# Patient Record
Sex: Female | Born: 1990 | Race: Black or African American | Hispanic: No | Marital: Single | Smoking: Current some day smoker
Health system: Southern US, Community
[De-identification: ages and names within clinical notes are randomized; demographics above are authoritative.]

## PROBLEM LIST (undated history)

## (undated) HISTORY — PX: TONSILLECTOMY: SUR1361

## (undated) HISTORY — PX: INDUCED ABORTION: SHX677

---
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
## 2008-05-11 ENCOUNTER — Emergency Department (HOSPITAL_COMMUNITY): Admission: EM | Admit: 2008-05-11 | Discharge: 2008-05-11 | Payer: Self-pay | Admitting: Family Medicine

## 2008-06-05 ENCOUNTER — Emergency Department (HOSPITAL_COMMUNITY): Admission: EM | Admit: 2008-06-05 | Discharge: 2008-06-05 | Payer: Self-pay | Admitting: Emergency Medicine

## 2008-07-10 ENCOUNTER — Emergency Department (HOSPITAL_COMMUNITY): Admission: EM | Admit: 2008-07-10 | Discharge: 2008-07-11 | Payer: Self-pay | Admitting: Emergency Medicine

## 2010-12-06 NOTE — Op Note (Signed)
Courtney Moss, Courtney Moss       ACCOUNT NO.:  [id number]   MEDICAL RECORD NO.:  [id number]          PATIENT TYPE:  AMB   LOCATION:  DSC                          FACILITY:  [facility]   PHYSICIAN:  Hermelinda Medicus, M.D.   DATE OF BIRTH:  [birth year]   DATE OF PROCEDURE:  02/18/2005  DATE OF DISCHARGE:                                 OPERATIVE REPORT   PREOPERATIVE DIAGNOSIS:  Tonsillitis with history of Streptococcus-positive.   POSTOPERATIVE DIAGNOSIS:  Tonsillitis with history of Streptococcus-  positive.   OPERATION:  Tonsillectomy.   ANESTHESIA:  General endotracheal with Dr. Noreene Larsson.   SURGEON:  Hermelinda Medicus, M.D.   PROCEDURE:  The patient was placed in a supine position and under general  orotracheal anesthesia, the tonsillar gag was placed after usual prepping  and draping.  The tonsils were exudative; they were grasped with a tenaculum  and removed using blunt and Bovie electrocoagulation for hemostasis and each  tonsil was separated and sent for microscopic evaluation.  All hemostasis  was established with the Bovie electrocoagulation.  Total blood loss was  less than 10 mL.  The patient tolerated the procedure well and is doing well  postop.   Followup will be in 1 week, 3 weeks and 6 weeks.  Parents are aware of the  dietary restrictions and also the travel restrictions of approximately 10-12  days of no travel way from medical care.       JC/MEDQ  D:  02/18/2005  T:  02/19/2005  Job:  [id number]   cc:   Caryn Bee L. Little, M.D.  [address]  [place]  [place] [address]  Fax: [phone]

## 2010-12-06 NOTE — H&P (Signed)
NAMEMARCHELLE, Courtney Moss       ACCOUNT NO.:  [id number]   MEDICAL RECORD NO.:  [id number]          PATIENT TYPE:  AMB   LOCATION:  DSC                          FACILITY:  [facility]   PHYSICIAN:  Hermelinda Medicus, M.D.   DATE OF BIRTH:  Nov 07, 1990   DATE OF ADMISSION:  02/18/2005  DATE OF DISCHARGE:                                HISTORY & PHYSICAL   This patient is a 20 year old female who has had a four-year history of  tonsillitis, six episodes per year, and has also been Strep positive on  several occasions.  She has had no other difficulties.  Her general health  has been good.  She, however, has missed some school with this and the  family wished to get her improved from this situation.   She has no allergies to medications.   She has never had previous surgery.   She has no bowel or bladder, kidney, respiratory problems.   Her medications have been primarily the cephalosporins and the Augmentin.  She takes no medications at this time.   The only family history is her Dad does have some hypertension problems, and  she has had some mild acne in the past.   PHYSICAL EXAMINATION:  VITAL SIGNS:  Blood pressure is 115/70, respirations  18, pulse 65.  HEENT:  Ears are clear.  Oral cavity is clear.  Tympanic membranes look  excellent.  Oral cavity is free of any ulceration or mass.  She just has  exudative 2+ size tonsils.  NECK:  Free of any thyromegaly, but has some mild cervical adenopathy, but  no tenderness.  CHEST:  Clear.  No rales, rhonchi, or wheezes.  CARDIOVASCULAR:  __________  murmurs or gallops.  ABDOMEN:  Unremarkable.  EXTREMITIES:  Unremarkable.   INITIAL DIAGNOSIS:  Tonsillitis.  A history of tonsillitis times six over a  four-year period, total of 24 episodes with multiple Streptococcal  infections with considerable loss of school.   PLAN:  Tonsillectomy.       JC/MEDQ  D:  02/18/2005  T:  02/18/2005  Job:  [id number]   cc:   Caryn Bee L. Little, M.D.  [address]  [place]  [place] [address]  Fax: [phone]

## 2011-04-22 LAB — POCT URINALYSIS DIP (DEVICE)
Bilirubin Urine: NEGATIVE
Glucose, UA: NEGATIVE mg/dL
Ketones, ur: NEGATIVE mg/dL
Nitrite: NEGATIVE
Operator id: 247071
Protein, ur: 30 mg/dL — AB
Specific Gravity, Urine: 1.015 (ref 1.005–1.030)
Urobilinogen, UA: 0.2 mg/dL (ref 0.0–1.0)
pH: 7 (ref 5.0–8.0)

## 2011-04-22 LAB — URINE CULTURE: Colony Count: 35000

## 2011-04-22 LAB — POCT PREGNANCY, URINE: Preg Test, Ur: NEGATIVE

## 2011-04-25 LAB — POCT I-STAT, CHEM 8
BUN: 9 mg/dL (ref 6–23)
Calcium, Ion: 1.05 mmol/L — ABNORMAL LOW (ref 1.12–1.32)
Chloride: 107 mEq/L (ref 96–112)
Creatinine, Ser: 0.9 mg/dL (ref 0.4–1.2)
Glucose, Bld: 90 mg/dL (ref 70–99)
HCT: 39 % (ref 36.0–46.0)
Hemoglobin: 13.3 g/dL (ref 12.0–15.0)
Potassium: 4.4 mEq/L (ref 3.5–5.1)
Sodium: 140 mEq/L (ref 135–145)
TCO2: 25 mmol/L (ref 0–100)

## 2011-04-25 LAB — URINALYSIS, ROUTINE W REFLEX MICROSCOPIC
Bilirubin Urine: NEGATIVE
Glucose, UA: NEGATIVE mg/dL
Ketones, ur: NEGATIVE mg/dL
Leukocytes, UA: NEGATIVE
Nitrite: NEGATIVE
Protein, ur: NEGATIVE mg/dL
Specific Gravity, Urine: 1.012 (ref 1.005–1.030)
Urobilinogen, UA: 0.2 mg/dL (ref 0.0–1.0)
pH: 7 (ref 5.0–8.0)

## 2011-04-25 LAB — DIFFERENTIAL
Basophils Absolute: 0 10*3/uL (ref 0.0–0.1)
Basophils Relative: 1 % (ref 0–1)
Eosinophils Absolute: 0.1 10*3/uL (ref 0.0–0.7)
Eosinophils Relative: 2 % (ref 0–5)
Lymphocytes Relative: 28 % (ref 12–46)
Lymphs Abs: 1.8 10*3/uL (ref 0.7–4.0)
Monocytes Absolute: 0.1 10*3/uL (ref 0.1–1.0)
Monocytes Relative: 2 % — ABNORMAL LOW (ref 3–12)
Neutro Abs: 4.2 10*3/uL (ref 1.7–7.7)
Neutrophils Relative %: 67 % (ref 43–77)

## 2011-04-25 LAB — RAPID URINE DRUG SCREEN, HOSP PERFORMED
Amphetamines: NOT DETECTED
Barbiturates: NOT DETECTED
Benzodiazepines: NOT DETECTED
Cocaine: NOT DETECTED
Opiates: NOT DETECTED
Tetrahydrocannabinol: NOT DETECTED

## 2011-04-25 LAB — CBC
HCT: 38.6 % (ref 36.0–46.0)
Hemoglobin: 12.8 g/dL (ref 12.0–15.0)
MCHC: 33.1 g/dL (ref 30.0–36.0)
MCV: 85.1 fL (ref 78.0–100.0)
Platelets: 239 10*3/uL (ref 150–400)
RBC: 4.53 MIL/uL (ref 3.87–5.11)
RDW: 14.2 % (ref 11.5–15.5)
WBC: 6.2 10*3/uL (ref 4.0–10.5)

## 2011-04-25 LAB — URINE MICROSCOPIC-ADD ON

## 2011-04-25 LAB — ETHANOL: Alcohol, Ethyl (B): <5 mg/dL (ref 0–10)

## 2014-01-23 ENCOUNTER — Encounter (HOSPITAL_COMMUNITY): Payer: Self-pay | Admitting: Emergency Medicine

## 2014-01-23 ENCOUNTER — Emergency Department (HOSPITAL_COMMUNITY)
Admission: EM | Admit: 2014-01-23 | Discharge: 2014-01-23 | Disposition: A | Discharge status: Home / Self Care | Payer: BC Managed Care – PPO | Attending: Emergency Medicine | Admitting: Emergency Medicine

## 2014-01-23 ENCOUNTER — Emergency Department (HOSPITAL_COMMUNITY): Payer: BC Managed Care – PPO

## 2014-01-23 DIAGNOSIS — Z87891 Personal history of nicotine dependence: Secondary | ICD-10-CM | POA: Insufficient documentation

## 2014-01-23 DIAGNOSIS — O9989 Other specified diseases and conditions complicating pregnancy, childbirth and the puerperium: Secondary | ICD-10-CM | POA: Insufficient documentation

## 2014-01-23 DIAGNOSIS — Z9889 Other specified postprocedural states: Secondary | ICD-10-CM | POA: Insufficient documentation

## 2014-01-23 DIAGNOSIS — N939 Abnormal uterine and vaginal bleeding, unspecified: Secondary | ICD-10-CM

## 2014-01-23 DIAGNOSIS — R1031 Right lower quadrant pain: Secondary | ICD-10-CM | POA: Insufficient documentation

## 2014-01-23 DIAGNOSIS — R1032 Left lower quadrant pain: Secondary | ICD-10-CM | POA: Insufficient documentation

## 2014-01-23 DIAGNOSIS — O209 Hemorrhage in early pregnancy, unspecified: Secondary | ICD-10-CM | POA: Insufficient documentation

## 2014-01-23 LAB — BASIC METABOLIC PANEL
Anion gap: 14 (ref 5–15)
BUN: 6 mg/dL (ref 6–23)
CO2: 23 mEq/L (ref 19–32)
Calcium: 9.1 mg/dL (ref 8.4–10.5)
Chloride: 102 mEq/L (ref 96–112)
Creatinine, Ser: 0.58 mg/dL (ref 0.50–1.10)
GFR calc Af Amer: >90 mL/min (ref >90)
GFR calc non Af Amer: >90 mL/min (ref >90)
Glucose, Bld: 80 mg/dL (ref 70–99)
Potassium: 4.4 mEq/L (ref 3.7–5.3)
Sodium: 139 mEq/L (ref 137–147)

## 2014-01-23 LAB — CBC WITH DIFFERENTIAL/PLATELET
Basophils Absolute: 0 10*3/uL (ref 0.0–0.1)
Basophils Relative: 0 % (ref 0–1)
Eosinophils Absolute: 0.1 10*3/uL (ref 0.0–0.7)
Eosinophils Relative: 1 % (ref 0–5)
HCT: 35.2 % — ABNORMAL LOW (ref 36.0–46.0)
Hemoglobin: 12.4 g/dL (ref 12.0–15.0)
Lymphocytes Relative: 29 % (ref 12–46)
Lymphs Abs: 2.2 10*3/uL (ref 0.7–4.0)
MCH: 28.4 pg (ref 26.0–34.0)
MCHC: 35.2 g/dL (ref 30.0–36.0)
MCV: 80.5 fL (ref 78.0–100.0)
Monocytes Absolute: 0.8 10*3/uL (ref 0.1–1.0)
Monocytes Relative: 11 % (ref 3–12)
Neutro Abs: 4.5 10*3/uL (ref 1.7–7.7)
Neutrophils Relative %: 59 % (ref 43–77)
Platelets: 218 10*3/uL (ref 150–400)
RBC: 4.37 MIL/uL (ref 3.87–5.11)
RDW: 12.9 % (ref 11.5–15.5)
WBC: 7.7 10*3/uL (ref 4.0–10.5)

## 2014-01-23 LAB — URINE MICROSCOPIC-ADD ON

## 2014-01-23 LAB — URINALYSIS, ROUTINE W REFLEX MICROSCOPIC
Bilirubin Urine: NEGATIVE
Glucose, UA: NEGATIVE mg/dL
Hgb urine dipstick: NEGATIVE
Ketones, ur: 40 mg/dL — AB
Nitrite: NEGATIVE
Protein, ur: NEGATIVE mg/dL
Specific Gravity, Urine: 1.019 (ref 1.005–1.030)
Urobilinogen, UA: 1 mg/dL (ref 0.0–1.0)
pH: 6.5 (ref 5.0–8.0)

## 2014-01-23 LAB — ABO/RH: ABO/RH(D): A POS

## 2014-01-23 LAB — HCG, QUANTITATIVE, PREGNANCY: hCG, Beta Chain, Quant, S: 48151 m[IU]/mL — ABNORMAL HIGH (ref <5)

## 2014-01-23 LAB — POC URINE PREG, ED: Preg Test, Ur: POSITIVE — AB

## 2014-01-23 MED ORDER — COMPLETENATE 29-1 MG PO CHEW
1.0000 | CHEWABLE_TABLET | Freq: Every day | ORAL | Status: AC
Start: 2014-01-23 — End: ?

## 2014-01-23 NOTE — ED Provider Notes (Signed)
CSN: [id number]     Arrival date & time 01/23/14  1611 History   First MD Initiated Contact with Patient 01/23/14 1928     Chief Complaint  Patient presents with  . Vaginal Bleeding     (Consider location/radiation/quality/duration/timing/severity/associated sxs/prior Treatment) HPI 23 year old female with no significant past medical history the presents here today with vaginal spotting with positive home pregnancy test. Patient states that the pregnancy test was 3 weeks prior however she believes the date of conception was the first week of May. Patient was taken the new ring was unable to have her prescription for that time period. Patient states the last week she has been having light vaginal spotting was noted clots, no bleeding more than a period. Patient also has been having bilateral suprapubic and lower quadrants abdominal cramping for the past week that is intermittent in nature not associated with food. Patient denies dysuria hematuria but has had recurrent UTIs in the past.  History reviewed. No pertinent past medical history. Past Surgical History  Procedure Laterality Date  . Tonsillectomy    . Induced abortion     History reviewed. No pertinent family history. History  Substance Use Topics  . Smoking status: Former Games developermoker  . Smokeless tobacco: Not on file  . Alcohol Use: Yes   OB History   Grav Para Term Preterm Abortions TAB SAB Ect Mult Living                 Review of Systems  Constitutional: Negative for activity change.  HENT: Negative for congestion.   Respiratory: Negative for cough and shortness of breath.   Cardiovascular: Negative for chest pain and leg swelling.  Gastrointestinal: Positive for abdominal pain. Negative for nausea, vomiting, diarrhea, constipation, blood in stool and abdominal distention.  Genitourinary: Positive for vaginal bleeding. Negative for dysuria, flank pain and vaginal discharge.  Musculoskeletal: Negative for back pain.  Skin:  Negative for color change.  Neurological: Negative for syncope and headaches.  Psychiatric/Behavioral: Negative for agitation.      Allergies  Review of patient's allergies indicates no known allergies.  Home Medications   Prior to Admission medications   Not on File   BP 121/79  Pulse 61  Temp(Src) 97.6 F (36.4 C) (Oral)  Resp 16  Ht 5\' 7"  (1.702 m)  Wt 170 lb 12.8 oz (77.474 kg)  BMI 26.74 kg/m2  SpO2 100% Physical Exam  Constitutional: She is oriented to person, place, and time. She appears well-developed.  HENT:  Head: Normocephalic.  Eyes: Pupils are equal, round, and reactive to light.  Neck: Neck supple.  Cardiovascular: Normal rate.  Exam reveals no gallop and no friction rub.   No murmur heard. Pulmonary/Chest: Effort normal and breath sounds normal. No respiratory distress.  Abdominal: Soft. She exhibits no distension. There is tenderness in the right lower quadrant, suprapubic area and left lower quadrant. There is no rigidity, no rebound, no guarding, no CVA tenderness, no tenderness at McBurney's point and negative Murphy's sign.  Genitourinary: Cervix exhibits no motion tenderness. Right adnexum displays no mass, no tenderness and no fullness. Left adnexum displays no mass, no tenderness and no fullness. No bleeding around the vagina. No vaginal discharge found.  Musculoskeletal: She exhibits no edema.  Neurological: She is alert and oriented to person, place, and time.  Skin: Skin is warm.  Psychiatric: She has a normal mood and affect.    ED Course  Procedures (including critical care time) Labs Review Labs Reviewed  URINALYSIS, ROUTINE  W REFLEX MICROSCOPIC - Abnormal; Notable for the following:    Ketones, ur 40 (*)    Leukocytes, UA TRACE (*)    All other components within normal limits  CBC WITH DIFFERENTIAL - Abnormal; Notable for the following:    HCT 35.2 (*)    All other components within normal limits  HCG, QUANTITATIVE, PREGNANCY -  Abnormal; Notable for the following:    hCG, Beta Chain, Quant, S [id number] (*)    All other components within normal limits  URINE MICROSCOPIC-ADD ON - Abnormal; Notable for the following:    Squamous Epithelial / LPF FEW (*)    All other components within normal limits  POC URINE PREG, ED - Abnormal; Notable for the following:    Preg Test, Ur POSITIVE (*)    All other components within normal limits  GC/CHLAMYDIA PROBE AMP  BASIC METABOLIC PANEL  ABO/RH    Imaging Review [place] Ob Comp Less 14 Wks  01/23/2014   CLINICAL DATA:  Vaginal bleeding, positive pregnancy test  EXAM: OBSTETRIC <14 WK US AND TRANSVAGINAL OB US  TECHNIQUE: Both transabdominal and transvaginal ultrasound examinations were performed for complete evaluation of the gestation as well as the maternal uterus, adnexal regions, and pelvic cul-de-sac. Transvaginal technique was performed to assess early pregnancy.  COMPARISON:  None.  FINDINGS: Intrauterine gestational sac: Visualized/normal in shape.  Yolk sac:  Visualized  Embryo:  Visualized  Cardiac Activity: Visualized  Heart Rate:  165 bpm  CRL:   23  mm   9 w 0d                  US EDC: 08/28/14  Maternal uterus/adnexae: Right ovary is obscured by bowel gas. No right adnexal mass. Left ovary is normal. No free fluid. Trace anechoic fluid may represent remote subchorionic hemorrhage.  IMPRESSION: Single live intrauterine embryo measuring 9 weeks 0 days by crown-rump length today. No acute abnormality.   Electronically Signed   By: Christiana PellantGretchen  Green M.D.   On: 01/23/2014 21:36   [place] Ob Transvaginal  01/23/2014   CLINICAL DATA:  Vaginal bleeding, positive pregnancy test  EXAM: OBSTETRIC <14 WK US AND TRANSVAGINAL OB US  TECHNIQUE: Both transabdominal and transvaginal ultrasound examinations were performed for complete evaluation of the gestation as well as the maternal uterus, adnexal regions, and pelvic cul-de-sac. Transvaginal technique was performed to assess early pregnancy.  COMPARISON:   None.  FINDINGS: Intrauterine gestational sac: Visualized/normal in shape.  Yolk sac:  Visualized  Embryo:  Visualized  Cardiac Activity: Visualized  Heart Rate:  165 bpm  CRL:   23  mm   9 w 0d                  US EDC: 08/28/14  Maternal uterus/adnexae: Right ovary is obscured by bowel gas. No right adnexal mass. Left ovary is normal. No free fluid. Trace anechoic fluid may represent remote subchorionic hemorrhage.  IMPRESSION: Single live intrauterine embryo measuring 9 weeks 0 days by crown-rump length today. No acute abnormality.   Electronically Signed   By: Christiana PellantGretchen  Green M.D.   On: 01/23/2014 21:36     EKG Interpretation None      MDM   Final diagnoses:  Vaginal bleeding   23 y/o female presents with first trimester vaginal bleeding and minimal lower abdominal pain. Vaginal exam- no blood in the vault and no discharge, no significant tenderness on bimanual exam.  Transabdominal US noted to reveal a 9week IUP with Fetal cardiac  activity.  On repeat exam patient does not have abdominal tenderness, patient started on prenatal vitamin and given resources to establish pre-natal care. Patient was then discharged with return precautions given.      Clement Sayres, MD 01/24/14 2225

## 2014-01-23 NOTE — ED Notes (Signed)
She had a positive home preg test 3 weeks ago. This week shes had vaginal spotting. She denies pain. She reports loss of appetite and n/v. She does not have an Web designerBGYN

## 2014-01-23 NOTE — ED Notes (Signed)
Apologized to pt for wait time. Pt in NAD. Denies any pain. Pt made aware of need for urine sample.

## 2014-01-23 NOTE — ED Notes (Signed)
Pt in US at this time 

## 2014-01-24 LAB — GC/CHLAMYDIA PROBE AMP
CT Probe RNA: NEGATIVE
GC Probe RNA: NEGATIVE

## 2014-01-25 NOTE — ED Provider Notes (Signed)
I saw and evaluated the patient, reviewed the resident's note and I agree with the findings and plan. If applicable, I agree with the resident's interpretation of the EKG.  If applicable, I was present for critical portions of any procedures performed.  +HCG with vaginal bleeding.  Abdomen soft and nontender.  Vitals stable. +IUP. +Rh.  Glynn OctaveStephen Andi Mahaffy, MD 01/25/14 [phone]

## 2015-06-18 ENCOUNTER — Emergency Department (HOSPITAL_COMMUNITY)
Admission: EM | Admit: 2015-06-18 | Discharge: 2015-06-18 | Discharge status: Against Medical Advice | Payer: BLUE CROSS/BLUE SHIELD | Attending: Emergency Medicine | Admitting: Emergency Medicine

## 2015-06-18 ENCOUNTER — Encounter (HOSPITAL_COMMUNITY): Payer: Self-pay | Admitting: Emergency Medicine

## 2015-06-18 DIAGNOSIS — R5383 Other fatigue: Secondary | ICD-10-CM | POA: Insufficient documentation

## 2015-06-18 DIAGNOSIS — Z3A00 Weeks of gestation of pregnancy not specified: Secondary | ICD-10-CM | POA: Insufficient documentation

## 2015-06-18 DIAGNOSIS — O9989 Other specified diseases and conditions complicating pregnancy, childbirth and the puerperium: Secondary | ICD-10-CM | POA: Insufficient documentation

## 2015-06-18 DIAGNOSIS — O219 Vomiting of pregnancy, unspecified: Secondary | ICD-10-CM | POA: Insufficient documentation

## 2015-06-18 LAB — URINALYSIS, ROUTINE W REFLEX MICROSCOPIC
Bilirubin Urine: NEGATIVE
GLUCOSE, UA: NEGATIVE mg/dL
HGB URINE DIPSTICK: NEGATIVE
KETONES UR: 15 mg/dL — AB
Leukocytes, UA: NEGATIVE
Nitrite: NEGATIVE
PROTEIN: 30 mg/dL — AB
Specific Gravity, Urine: 1.016 (ref 1.005–1.030)
pH: 6 (ref 5.0–8.0)

## 2015-06-18 LAB — URINE MICROSCOPIC-ADD ON: RBC / HPF: NONE SEEN RBC/hpf (ref 0–5)

## 2015-06-18 LAB — CBC
HCT: 40.4 % (ref 36.0–46.0)
Hemoglobin: 14.2 g/dL (ref 12.0–15.0)
MCH: 28.6 pg (ref 26.0–34.0)
MCHC: 35.1 g/dL (ref 30.0–36.0)
MCV: 81.3 fL (ref 78.0–100.0)
PLATELETS: 250 10*3/uL (ref 150–400)
RBC: 4.97 MIL/uL (ref 3.87–5.11)
RDW: 13.4 % (ref 11.5–15.5)
WBC: 6.7 10*3/uL (ref 4.0–10.5)

## 2015-06-18 LAB — COMPREHENSIVE METABOLIC PANEL
ALK PHOS: 58 U/L (ref 38–126)
ALT: 12 U/L — AB (ref 14–54)
AST: 17 U/L (ref 15–41)
Albumin: 3.6 g/dL (ref 3.5–5.0)
Anion gap: 8 (ref 5–15)
BUN: 5 mg/dL — AB (ref 6–20)
CALCIUM: 8.7 mg/dL — AB (ref 8.9–10.3)
CHLORIDE: 104 mmol/L (ref 101–111)
CO2: 23 mmol/L (ref 22–32)
Creatinine, Ser: 0.64 mg/dL (ref 0.44–1.00)
GFR calc Af Amer: >60 mL/min (ref >60)
Glucose, Bld: 94 mg/dL (ref 65–99)
Potassium: 3.4 mmol/L — ABNORMAL LOW (ref 3.5–5.1)
Sodium: 135 mmol/L (ref 135–145)
Total Bilirubin: 0.9 mg/dL (ref 0.3–1.2)
Total Protein: 7 g/dL (ref 6.5–8.1)

## 2015-06-18 LAB — POC URINE PREG, ED: Preg Test, Ur: POSITIVE — AB

## 2015-06-18 LAB — LIPASE, BLOOD: LIPASE: 24 U/L (ref 11–51)

## 2015-06-18 NOTE — ED Notes (Signed)
Pt stated that she didn't want to wait any longer and that she would come back if pain got worse

## 2015-06-18 NOTE — ED Notes (Signed)
Pt. reports fatigue , chills , emesis and diarrhea onset last week .

## 2017-06-16 ENCOUNTER — Emergency Department (HOSPITAL_COMMUNITY)
Admission: EM | Admit: 2017-06-16 | Discharge: 2017-06-16 | Disposition: A | Discharge status: Home / Self Care | Payer: Medicaid Other | Attending: Emergency Medicine | Admitting: Emergency Medicine

## 2017-06-16 ENCOUNTER — Encounter (HOSPITAL_COMMUNITY): Payer: Self-pay

## 2017-06-16 ENCOUNTER — Emergency Department (HOSPITAL_COMMUNITY): Payer: Medicaid Other

## 2017-06-16 ENCOUNTER — Other Ambulatory Visit: Payer: Self-pay

## 2017-06-16 DIAGNOSIS — Z79899 Other long term (current) drug therapy: Secondary | ICD-10-CM | POA: Diagnosis not present

## 2017-06-16 DIAGNOSIS — Z87891 Personal history of nicotine dependence: Secondary | ICD-10-CM | POA: Insufficient documentation

## 2017-06-16 DIAGNOSIS — B349 Viral infection, unspecified: Secondary | ICD-10-CM | POA: Insufficient documentation

## 2017-06-16 DIAGNOSIS — R05 Cough: Secondary | ICD-10-CM | POA: Diagnosis present

## 2017-06-16 LAB — I-STAT BETA HCG BLOOD, ED (MC, WL, AP ONLY): I-stat hCG, quantitative: <5 m[IU]/mL (ref <5)

## 2017-06-16 LAB — RAPID STREP SCREEN (MED CTR MEBANE ONLY): Streptococcus, Group A Screen (Direct): NEGATIVE

## 2017-06-16 LAB — D-DIMER, QUANTITATIVE: D-Dimer, Quant: 1.72 ug/mL-FEU — ABNORMAL HIGH (ref 0.00–0.50)

## 2017-06-16 MED ORDER — IBUPROFEN 800 MG PO TABS
800.0000 mg | ORAL_TABLET | Freq: Once | ORAL | Status: AC
  Administered 2017-06-16: 800 mg via ORAL
  Filled 2017-06-16: qty 1

## 2017-06-16 MED ORDER — FLUTICASONE PROPIONATE 50 MCG/ACT NA SUSP: 1.0000 | Freq: Every day | NASAL | 2 refills | Status: AC

## 2017-06-16 MED ORDER — IBUPROFEN 800 MG PO TABS: 800.0000 mg | ORAL_TABLET | Freq: Three times a day (TID) | ORAL | 0 refills | Status: AC | PRN

## 2017-06-16 MED ORDER — BENZONATATE 100 MG PO CAPS: 100.0000 mg | ORAL_CAPSULE | Freq: Three times a day (TID) | ORAL | 0 refills | Status: AC | PRN

## 2017-06-16 MED ORDER — BENZONATATE 100 MG PO CAPS
100.0000 mg | ORAL_CAPSULE | Freq: Once | ORAL | Status: AC
  Administered 2017-06-16: 100 mg via ORAL
  Filled 2017-06-16: qty 1

## 2017-06-16 MED ORDER — IOPAMIDOL (ISOVUE-370) INJECTION 76%
INTRAVENOUS | Status: AC
  Administered 2017-06-16: 100 mL
  Filled 2017-06-16: qty 100

## 2017-06-16 NOTE — Discharge Instructions (Addendum)
You were seen in the emergency department today and diagnosed with a viral illness. I have prescribed you Flonase to help with your congestion- use this daily, 1 spray in each nostril. I have prescribed you Tessalon to help with your cough take this once every 8 hours as needed for cough. I have prescribed you Ibuprofen- take this every 8 hours as needed for pain. Take the Ibuprofen with food as this may upset your stomach and at worst cause stomach bleeding. Your chest X-ray did not show any signs of pneumonia. Your CT scan did not show a blood clot in your lungs.   Follow up with the internal medicine office provided to establish a primary care provider in the area and for re-evaluation. Return to the emergency department for any new/worsening symptoms including but not limited to shortness of breath, dizziness, lightheadedness, or passing out episode.

## 2017-06-16 NOTE — ED Provider Notes (Signed)
[facility]   CSN: [id number] Arrival date & time: 06/16/17  [id number]     History   Chief Complaint Chief Complaint  Patient presents with  . sore throat/congestion/back pain    HPI Courtney Moss is a 26 y.o. female presents with complaints of URI sxs x 3 days and chest/upper back pain x 1 day. Patient states symptoms started with congestion, rhinorrhea, L ear discomfort, subjective fever, chills, and sore throat. Progressed to intermittent cough, sputum produced is green with blood. States she then developed central chest discomfort and diffuse upper back pain. Relays this has been a constant pain that is sharp and worse with a deep breath and coughing. Has tried Tylenol without relief. States feels somewhat similar to previous pneumonia. Denies leg pain/swelling, recent surgery/trauma, recent long travel, hormone use, personal hx of cancer, or hx of DVT/PE. LMP 1.5 weeks prior, denies chance of pregnancy.  HPI  History reviewed. No pertinent past medical history.  There are no active problems to display for this patient.   Past Surgical History:  Procedure Laterality Date  . INDUCED ABORTION    . TONSILLECTOMY      OB History    No data available       Home Medications    Prior to Admission medications   Medication Sig Start Date End Date Taking? Authorizing Provider  acetaminophen (TYLENOL) 325 MG tablet Take 650 mg by mouth every 6 (six) hours as needed for headache.    [provider]  fexofenadine-pseudoephedrine (ALLEGRA-D) 60-120 MG per tablet Take 1 tablet by mouth 2 (two) times daily.    [provider]  prenatal vitamin w/FE, FA (NATACHEW) 29-1 MG CHEW chewable tablet Chew 1 tablet by mouth daily at 12 noon. 01/23/14   Clement SayresShepard, Staci, MD  pseudoephedrine (SUDAFED) 30 MG tablet Take 30 mg by mouth every 4 (four) hours as needed for congestion.    [provider]    Family  History No family history on file.  Social History Social History   Tobacco Use  . Smoking status: Former Smoker  Substance Use Topics  . Alcohol use: Yes  . Drug use: Yes     Allergies   Patient has no known allergies.   Review of Systems Review of Systems  Constitutional: Positive for chills and fever (subjective).  HENT: Positive for congestion, ear pain (L), rhinorrhea and sore throat.   Eyes: Negative for discharge, redness, itching and visual disturbance.  Respiratory: Positive for cough. Negative for shortness of breath and wheezing.   Cardiovascular: Positive for chest pain. Negative for palpitations.  Gastrointestinal: Negative for abdominal pain, diarrhea and vomiting.  Genitourinary: Negative for dysuria, vaginal bleeding and vaginal discharge.  Musculoskeletal: Positive for back pain.       Negative for leg pain or swelling  Skin: Negative for rash.  Neurological: Negative for dizziness, syncope and weakness.       Negative for incontinence  All other systems reviewed and are negative.   Physical Exam Updated Vital Signs BP 129/80 (BP Location: Right Arm)   Pulse 65   Temp 98.4 F (36.9 C) (Oral)   Resp 16   SpO2 100%   Physical Exam  Constitutional: She appears well-developed and well-nourished. No distress.  HENT:  Head: Normocephalic and atraumatic.  Right Ear: Tympanic membrane is not perforated, not erythematous, not retracted and not bulging.  Left Ear: Tympanic membrane is not perforated, not erythematous, not retracted and not bulging.  Mouth/Throat: Uvula is midline. Posterior oropharyngeal erythema present. No oropharyngeal exudate.  Nasal congestion present.   Eyes: Conjunctivae are normal. Pupils are equal, round, and reactive to light. Right eye exhibits no discharge. Left eye exhibits no discharge.  Neck: Normal range of motion. Neck supple.  Cardiovascular: Normal rate and regular rhythm.  No murmur heard. Pulmonary/Chest: Breath  sounds normal. No respiratory distress. She has no wheezes. She has no rales.  Abdominal: Soft. She exhibits no distension. There is no tenderness.  Musculoskeletal:  Back: No vertebral tenderness.   Lymphadenopathy:    She has no cervical adenopathy.  Neurological: She is alert.  Clear speech. Normal gait. 5/5 grip strength and plantar/dorsi flexion bilaterally. Sensation grossly intact x 4.   Skin: Skin is warm and dry. No rash noted.  Psychiatric: She has a normal mood and affect. Her behavior is normal.  Nursing note and vitals reviewed.     ED Treatments / Results  Labs Results for orders placed or performed during the hospital encounter of 06/16/17  Rapid strep screen  Result Value Ref Range   Streptococcus, Group A Screen (Direct) NEGATIVE NEGATIVE  D-dimer, quantitative  Result Value Ref Range   D-Dimer, Quant 1.72 (H) 0.00 - 0.50 ug/mL-FEU  I-Stat Beta hCG blood, ED ([facility], [facility], [facility] only)  Result Value Ref Range   I-stat hCG, quantitative <5.0 <5 mIU/mL   Comment 3           Dg Chest 2 View  Result Date: 06/16/2017 CLINICAL DATA:  Cough. EXAM: CHEST  2 VIEW COMPARISON:  07/10/08 FINDINGS: The heart size and mediastinal contours are within normal limits. Both lungs are clear. The visualized skeletal structures are unremarkable. IMPRESSION: No active cardiopulmonary disease. Electronically Signed   By: Signa Kellaylor  Stroud M.D.   On: 06/16/2017 10:12   Ct Angio Chest Pe W/cm &/or Wo Cm  Result Date: 06/16/2017 CLINICAL DATA:  26 year old female with cough for 3 days, shoulder and upper back pain and positive D-dimer. EXAM: CT ANGIOGRAPHY CHEST WITH CONTRAST TECHNIQUE: Multidetector CT imaging of the chest was performed using the standard protocol during bolus administration of intravenous contrast. Multiplanar CT image reconstructions and MIPs were obtained to evaluate the vascular anatomy. CONTRAST:  100mL ISOVUE-370 IOPAMIDOL (ISOVUE-370) INJECTION 76% COMPARISON:  07/16/2017 chest  radiograph FINDINGS: Cardiovascular: This study is technically adequate but contrast opacification of some segmental arteries is less than optimal. No pulmonary emboli are identified. Heart size normal. No evidence of thoracic aortic aneurysm or pericardial effusion. Mediastinum/Nodes: No enlarged mediastinal, hilar, or axillary lymph nodes. Thyroid gland, trachea, and esophagus demonstrate no significant findings. Lungs/Pleura: Minimal left basilar atelectasis/scarring noted. There is no evidence of airspace disease, consolidation, nodule, mass, pleural effusion or pneumothorax. Upper Abdomen: Unremarkable Musculoskeletal: Unremarkable Review of the MIP images confirms the above findings. IMPRESSION: 1. No evidence of pulmonary emboli or thoracic aortic aneurysm. 2. Minimal left basilar atelectasis/scarring 3. No other significant abnormalities. Electronically Signed   By: Harmon PierJeffrey  Hu M.D.   On: 06/16/2017 12:47      Medications Ordered in ED Medications  ibuprofen (ADVIL,MOTRIN) tablet 800 mg (not administered)  benzonatate (TESSALON) capsule 100 mg (not administered)  iopamidol (ISOVUE-370) 76 % injection (100 mLs  Contrast Given 06/16/17 1227)     Initial Impression / Assessment and Plan / ED Course  I have reviewed the triage vital signs and the nursing notes.  Pertinent labs & imaging results that were available during my care of the patient were reviewed by me and  considered in my medical decision making (see chart for details).    Patient presents with URI symptoms as well as check/back discomfort. Patient is nontoxic appearing, in no apparent distress, vital signs are stable. Patient is afebrile without consolidation on chest x-ray, doubt pneumonia. No wheezing on exam. Rapid strep negative. Patient with hemoptysis that is difficult to quantify/qualify, given this in combination with chest discomfort with quality that is worse with deep breath considering pulmonary embolism (Wells 1, PERC  1). D-dimer was ordered and elevated at 1.72, therefore CTA was ordered, negative for PE. Suspect viral etiology to patient's illness, will treat supportively with Ibuprofen, Flonase, and Tessalon. Discussed results, treatment plan, return precautions, and need for PCP follow up with patient, provided opportunity for questions, patient confirmed understanding and comfortable with plans for DC home.   Final Clinical Impressions(s) / ED Diagnoses   Final diagnoses:  Viral illness    ED Discharge Orders        Ordered    fluticasone (FLONASE) 50 MCG/ACT nasal spray  Daily     06/16/17 1255    benzonatate (TESSALON) 100 MG capsule  3 times daily PRN     06/16/17 1255    ibuprofen (ADVIL,MOTRIN) 800 MG tablet  Every 8 hours PRN     06/16/17 1255       Auren Valdes, [place] R, PA-C 06/16/17 1321    Vanetta Mulders, MD 06/17/17 1624

## 2017-06-16 NOTE — ED Notes (Signed)
Patient transported to X-ray 

## 2017-06-16 NOTE — ED Triage Notes (Signed)
Patient complains of sore throat, congestion and cough x 3 days. Complains of shoulder and thoracic back pain for same, NAD

## 2017-06-16 NOTE — ED Notes (Signed)
Patient transported to CT 

## 2017-06-16 NOTE — ED Notes (Signed)
ED Provider at bedside. 

## 2017-06-18 LAB — CULTURE, GROUP A STREP (THRC)

## 2018-01-31 ENCOUNTER — Encounter (HOSPITAL_BASED_OUTPATIENT_CLINIC_OR_DEPARTMENT_OTHER): Payer: Self-pay | Admitting: Emergency Medicine

## 2018-01-31 ENCOUNTER — Other Ambulatory Visit: Payer: Self-pay

## 2018-01-31 ENCOUNTER — Emergency Department (HOSPITAL_BASED_OUTPATIENT_CLINIC_OR_DEPARTMENT_OTHER)
Admission: EM | Admit: 2018-01-31 | Discharge: 2018-01-31 | Disposition: A | Discharge status: Home / Self Care | Payer: Medicaid Other | Attending: Emergency Medicine | Admitting: Emergency Medicine

## 2018-01-31 DIAGNOSIS — Y929 Unspecified place or not applicable: Secondary | ICD-10-CM | POA: Insufficient documentation

## 2018-01-31 DIAGNOSIS — Y998 Other external cause status: Secondary | ICD-10-CM | POA: Insufficient documentation

## 2018-01-31 DIAGNOSIS — Z87891 Personal history of nicotine dependence: Secondary | ICD-10-CM | POA: Diagnosis not present

## 2018-01-31 DIAGNOSIS — S60444A External constriction of right ring finger, initial encounter: Secondary | ICD-10-CM | POA: Insufficient documentation

## 2018-01-31 DIAGNOSIS — W4904XA Ring or other jewelry causing external constriction, initial encounter: Secondary | ICD-10-CM | POA: Diagnosis not present

## 2018-01-31 DIAGNOSIS — S60449A External constriction of unspecified finger, initial encounter: Secondary | ICD-10-CM

## 2018-01-31 DIAGNOSIS — Y939 Activity, unspecified: Secondary | ICD-10-CM | POA: Insufficient documentation

## 2018-01-31 NOTE — ED Triage Notes (Signed)
Patient states that she has a ring stuck on her right ring finger

## 2018-01-31 NOTE — ED Provider Notes (Signed)
[facility] EMERGENCY DEPARTMENT Provider Note   CSN: [id number] Arrival date & time: 01/31/18  1456     History   Chief Complaint Chief Complaint  Patient presents with  . Ring stuck    HPI Courtney Moss is a 27 y.o. female.  HPI  Pt is a 27 year old female who presents today for evaluation of a ring stuck on her right ring finger.  She has tried taking this off with floss, ribbon, soap and water, lotion is been unsuccessful.  No numbness to the finger.  Has constant pain.  Exacerbated with palpation.  Pain is severe.  History reviewed. No pertinent past medical history.  There are no active problems to display for this patient.   Past Surgical History:  Procedure Laterality Date  . INDUCED ABORTION    . TONSILLECTOMY       OB History   None      Home Medications    Prior to Admission medications   Medication Sig Start Date End Date Taking? Authorizing Provider  acetaminophen (TYLENOL) 325 MG tablet Take 650 mg by mouth every 6 (six) hours as needed for headache.    [provider]  benzonatate (TESSALON) 100 MG capsule Take 1 capsule (100 mg total) by mouth 3 (three) times daily as needed for cough. 06/16/17   Petrucelli, Samantha R, PA-C  fexofenadine-pseudoephedrine (ALLEGRA-D) 60-120 MG per tablet Take 1 tablet by mouth 2 (two) times daily.    [provider]  fluticasone (FLONASE) 50 MCG/ACT nasal spray Place 1 spray into both nostrils daily. 06/16/17   Petrucelli, Samantha R, PA-C  ibuprofen (ADVIL,MOTRIN) 800 MG tablet Take 1 tablet (800 mg total) by mouth every 8 (eight) hours as needed for moderate pain. 06/16/17   Petrucelli, Samantha R, PA-C  prenatal vitamin w/FE, FA (NATACHEW) 29-1 MG CHEW chewable tablet Chew 1 tablet by mouth daily at 12 noon. 01/23/14   Clement SayresShepard, Staci, MD  pseudoephedrine (SUDAFED) 30 MG tablet Take 30 mg by mouth every 4 (four) hours as needed for congestion.    [provider]    Family  History History reviewed. No pertinent family history.  Social History Social History   Tobacco Use  . Smoking status: Former Games developermoker  . Smokeless tobacco: Never Used  Substance Use Topics  . Alcohol use: Yes  . Drug use: Yes     Allergies   Patient has no known allergies.   Review of Systems Review of Systems  Constitutional: Negative for fever.  Musculoskeletal:       Pain on ring finger  Skin: Negative for wound.  Neurological: Negative for numbness.     Physical Exam Updated Vital Signs BP 137/85 (BP Location: Left Arm)   Pulse 81   Temp 98.4 F (36.9 C) (Oral)   Resp 19   Ht 5\' 7"  (1.702 m)   Wt 97.1 kg (214 lb)   LMP 01/01/2018   SpO2 100%   BMI 33.52 kg/m   Physical Exam  Constitutional: She appears well-developed and well-nourished. No distress.  HENT:  Head: Normocephalic and atraumatic.  Eyes: Conjunctivae are normal.  Neck: Neck supple.  Cardiovascular: Normal rate.  Pulmonary/Chest: Effort normal.  Musculoskeletal: Normal range of motion.  Ring stuck on right 4th finger  Neurological: She is alert.  Skin: Skin is warm and dry.  Psychiatric: She has a normal mood and affect.  Nursing note and vitals reviewed.    ED Treatments / Results  Labs (all labs ordered are listed,  but only abnormal results are displayed) Labs Reviewed - No data to display  EKG None  Radiology No results found.  Procedures Procedures (including critical care time)  Medications Ordered in ED Medications - No data to display   Initial Impression / Assessment and Plan / ED Course  I have reviewed the triage vital signs and the nursing notes.  Pertinent labs & imaging results that were available during my care of the patient were reviewed by me and considered in my medical decision making (see chart for details).    Final Clinical Impressions(s) / ED Diagnoses   Final diagnoses:  Tight ring on finger   Patient presented with ring stuck on her  finger.  Ring was removed in the ED.  No wounds to the finger.  Neurovascularly intact.  ED Discharge Orders    None       Rayne Du 01/31/18 1519    Vanetta Mulders, MD 01/31/18 1528

## 2018-01-31 NOTE — Discharge Instructions (Signed)
Please follow up with your primary care provider within 5-7 days for re-evaluation of your symptoms. If you do not have a primary care provider, information for a healthcare clinic has been provided for you to make arrangements for follow up care. Please return to the emergency department for any new or worsening symptoms. ° °

## 2018-10-06 IMAGING — CT CT ANGIO CHEST
2 of 6 series · 18 of 36 positions shown · IV contrast (Omni 300)
Comparison: 07/16/2017 chest radiograph

CLINICAL DATA: 18-year-old female with cough for 3 days, shoulder
and upper back pain and positive D-dimer.

EXAM:
CT ANGIOGRAPHY CHEST WITH CONTRAST
TECHNIQUE: Multidetector CT imaging of the chest was performed using the
standard protocol during bolus administration of intravenous
contrast. Multiplanar CT image reconstructions and MIPs were
obtained to evaluate the vascular anatomy.
CONTRAST:  100mL CAZY66-OCN IOPAMIDOL (CAZY66-OCN) INJECTION 76%

[Series 6: pe thins · axial · 0.74mm/px · z∈[+937,+1201]mm · 17 of 298 slices shown]
[im 17/298  lung]
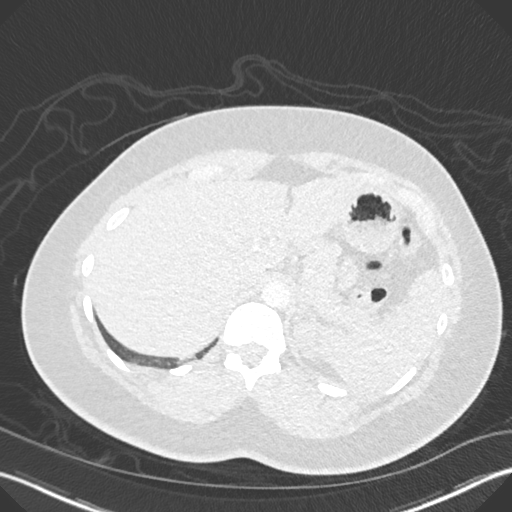
[im 34/298  mediastinal]
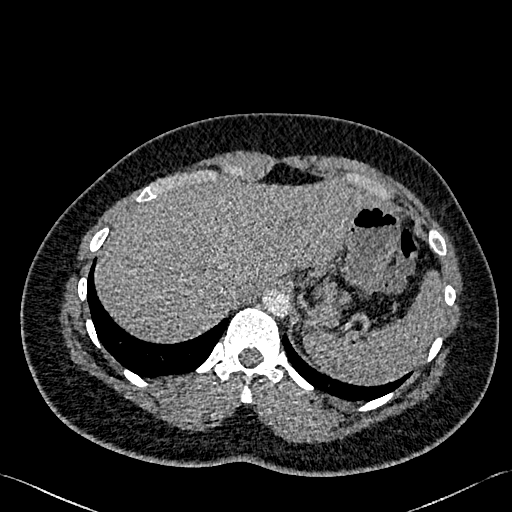
[im 50/298  lung]
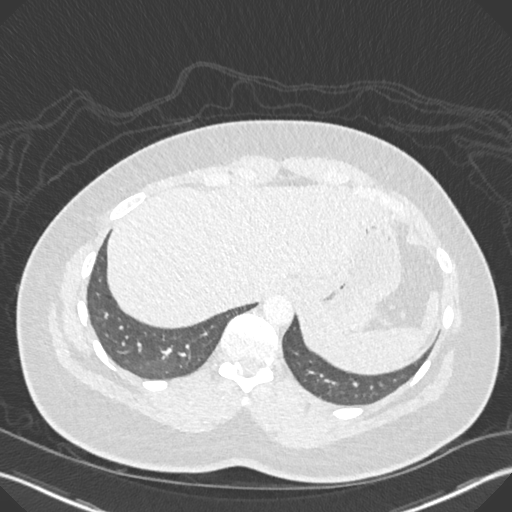
[im 67/298  mediastinal]
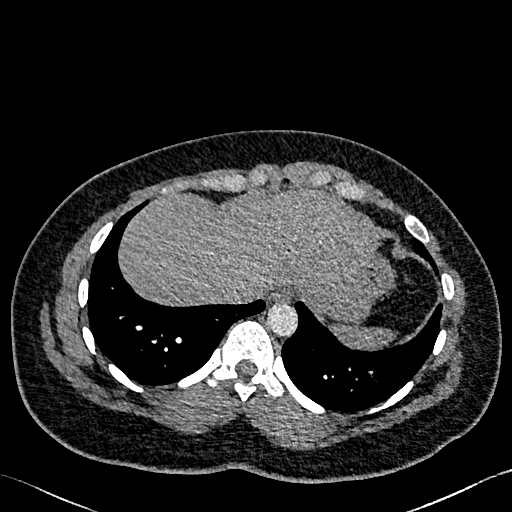
[im 83/298  lung]
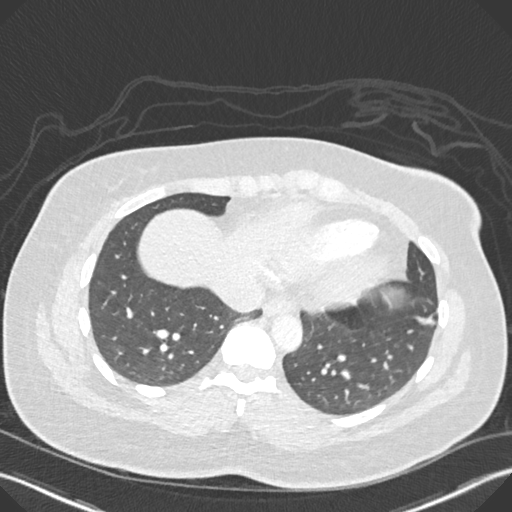
[im 100/298  mediastinal]
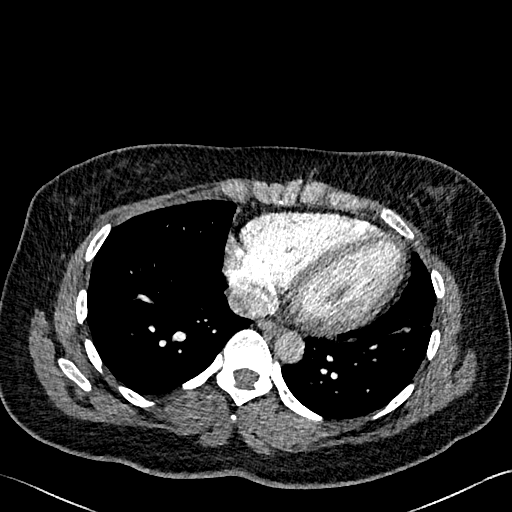
[im 116/298  lung]
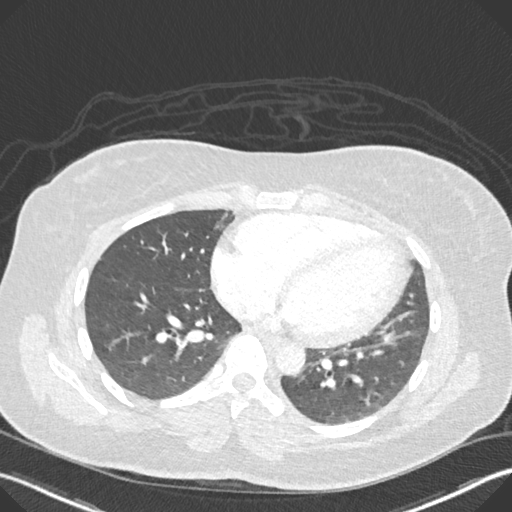
[im 133/298  mediastinal]
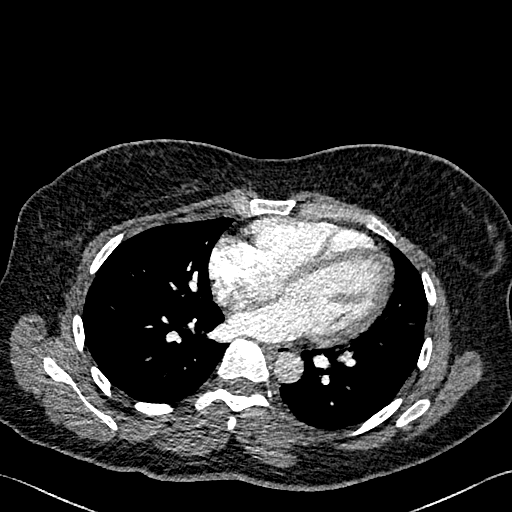
[im 149/298  lung]
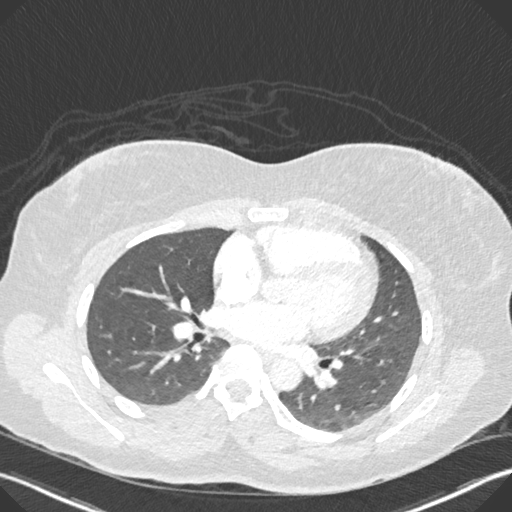
[im 166/298  mediastinal]
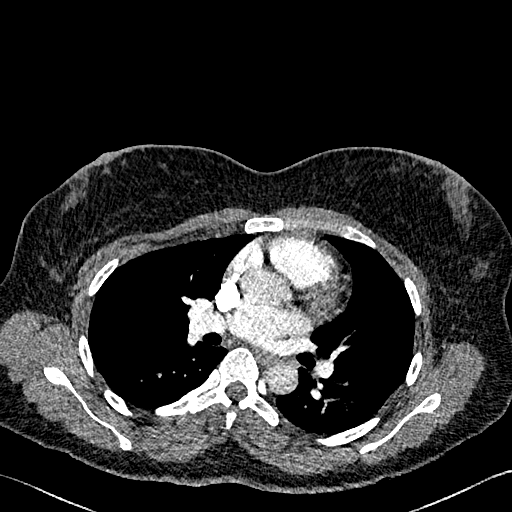
[im 182/298  lung]
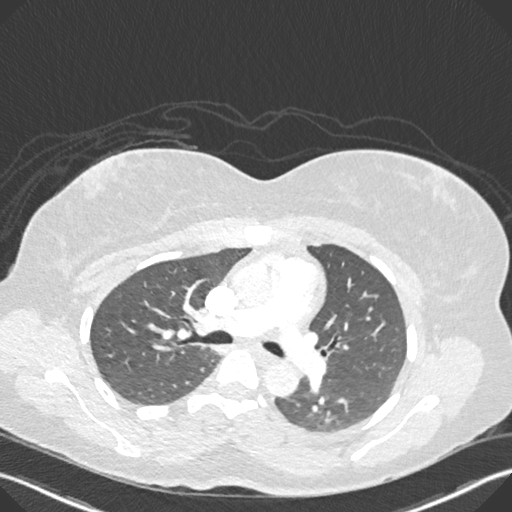
[im 199/298  mediastinal]
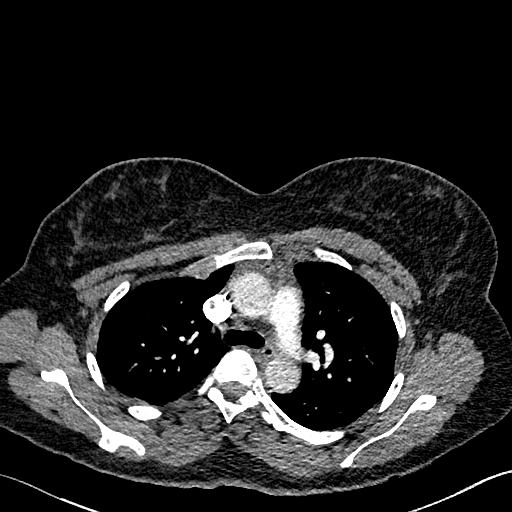
[im 215/298  lung]
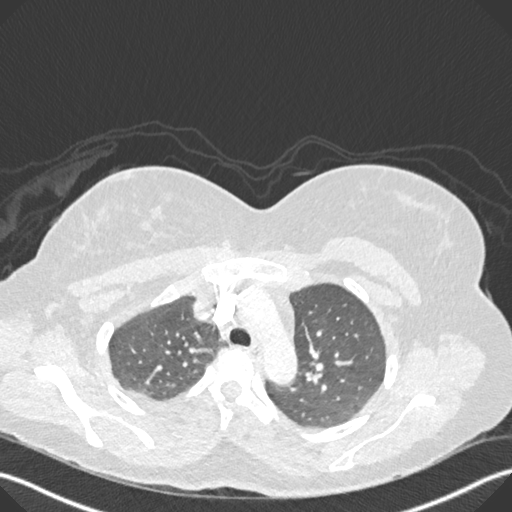
[im 232/298  mediastinal]
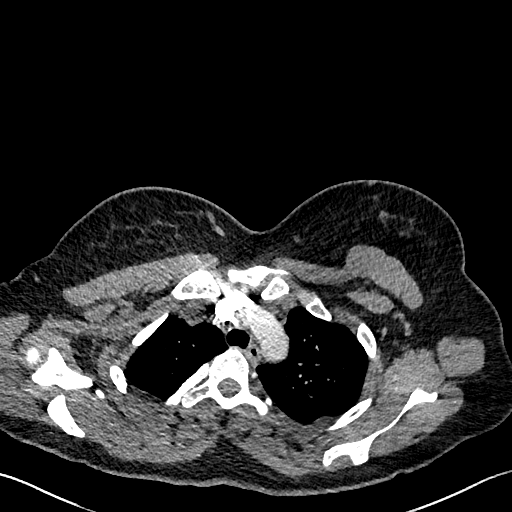
[im 248/298  lung]
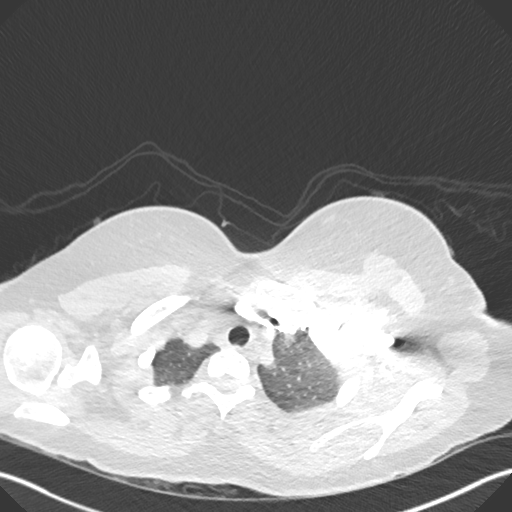
[im 265/298  mediastinal]
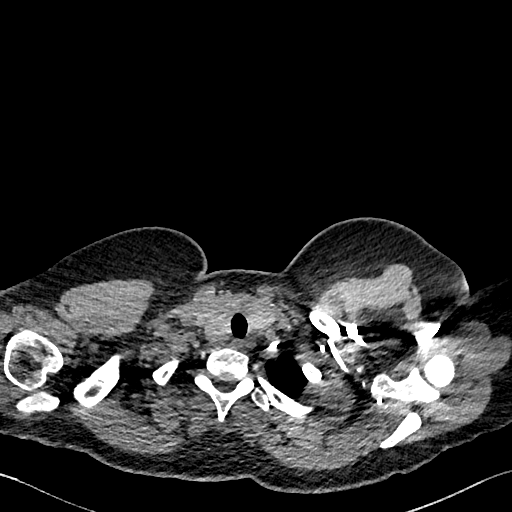
[im 281/298  lung]
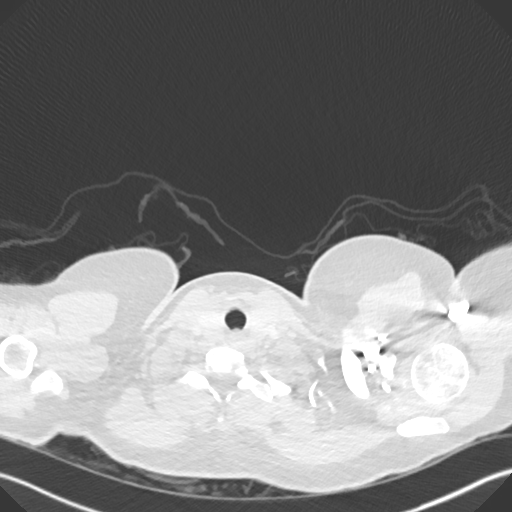

[Series 8: pe 2mm cor · coronal · 0.66mm/px · 1 of 153 slices shown]
[im 77/153  mediastinal]
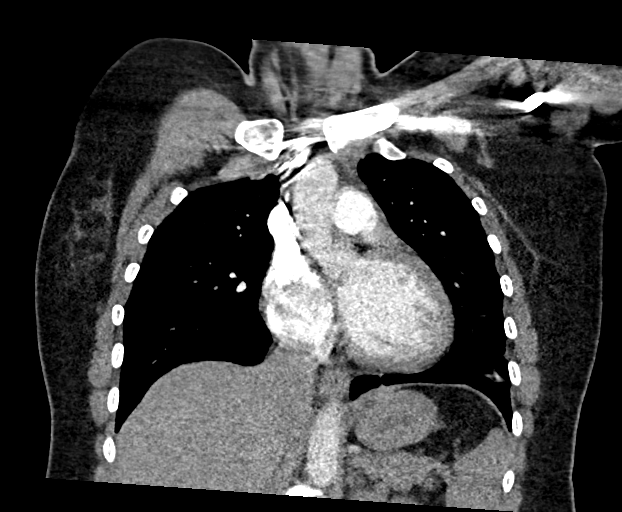

[18 of 36 positions shown; findings below may reference images not displayed]

FINDINGS: Cardiovascular: This study is technically adequate but contrast
opacification of some segmental arteries is less than optimal.

No pulmonary emboli are identified. Heart size normal. No evidence
of thoracic aortic aneurysm or pericardial effusion.

Mediastinum/Nodes: No enlarged mediastinal, hilar, or axillary lymph
nodes. Thyroid gland, trachea, and esophagus demonstrate no
significant findings.

Lungs/Pleura: Minimal left basilar atelectasis/scarring noted. There
is no evidence of airspace disease, consolidation, nodule, mass,
pleural effusion or pneumothorax.

Upper Abdomen: Unremarkable

Musculoskeletal: Unremarkable

Review of the MIP images confirms the above findings.
IMPRESSION: 1. No evidence of pulmonary emboli or thoracic aortic aneurysm.
2. Minimal left basilar atelectasis/scarring
3. No other significant abnormalities.

## 2019-09-26 ENCOUNTER — Emergency Department (HOSPITAL_BASED_OUTPATIENT_CLINIC_OR_DEPARTMENT_OTHER)
Admission: EM | Admit: 2019-09-26 | Discharge: 2019-09-26 | Disposition: A | Discharge status: Home / Self Care | Payer: Medicaid Other | Attending: Emergency Medicine | Admitting: Emergency Medicine

## 2019-09-26 ENCOUNTER — Other Ambulatory Visit: Payer: Self-pay

## 2019-09-26 ENCOUNTER — Encounter (HOSPITAL_BASED_OUTPATIENT_CLINIC_OR_DEPARTMENT_OTHER): Payer: Self-pay | Admitting: Emergency Medicine

## 2019-09-26 DIAGNOSIS — Z79899 Other long term (current) drug therapy: Secondary | ICD-10-CM | POA: Diagnosis not present

## 2019-09-26 DIAGNOSIS — L299 Pruritus, unspecified: Secondary | ICD-10-CM | POA: Insufficient documentation

## 2019-09-26 DIAGNOSIS — R21 Rash and other nonspecific skin eruption: Secondary | ICD-10-CM | POA: Diagnosis not present

## 2019-09-26 DIAGNOSIS — Z87891 Personal history of nicotine dependence: Secondary | ICD-10-CM | POA: Insufficient documentation

## 2019-09-26 LAB — CBC WITH DIFFERENTIAL/PLATELET
Abs Immature Granulocytes: 0.01 10*3/uL (ref 0.00–0.07)
Basophils Absolute: 0 10*3/uL (ref 0.0–0.1)
Basophils Relative: 1 %
Eosinophils Absolute: 0.2 10*3/uL (ref 0.0–0.5)
Eosinophils Relative: 3 %
HCT: 39.7 % (ref 36.0–46.0)
Hemoglobin: 13.2 g/dL (ref 12.0–15.0)
Immature Granulocytes: 0 %
Lymphocytes Relative: 33 %
Lymphs Abs: 1.9 10*3/uL (ref 0.7–4.0)
MCH: 29.1 pg (ref 26.0–34.0)
MCHC: 33.2 g/dL (ref 30.0–36.0)
MCV: 87.6 fL (ref 80.0–100.0)
Monocytes Absolute: 0.6 10*3/uL (ref 0.1–1.0)
Monocytes Relative: 10 %
Neutro Abs: 3.1 10*3/uL (ref 1.7–7.7)
Neutrophils Relative %: 53 %
Platelets: 226 10*3/uL (ref 150–400)
RBC: 4.53 MIL/uL (ref 3.87–5.11)
RDW: 13.7 % (ref 11.5–15.5)
WBC: 5.9 10*3/uL (ref 4.0–10.5)
nRBC: 0 % (ref 0.0–0.2)

## 2019-09-26 LAB — COMPREHENSIVE METABOLIC PANEL
ALT: 11 U/L (ref 0–44)
AST: 17 U/L (ref 15–41)
Albumin: 3.7 g/dL (ref 3.5–5.0)
Alkaline Phosphatase: 49 U/L (ref 38–126)
Anion gap: 7 (ref 5–15)
BUN: 7 mg/dL (ref 6–20)
CO2: 24 mmol/L (ref 22–32)
Calcium: 8.5 mg/dL — ABNORMAL LOW (ref 8.9–10.3)
Chloride: 106 mmol/L (ref 98–111)
Creatinine, Ser: 0.73 mg/dL (ref 0.44–1.00)
GFR calc Af Amer: >60 mL/min (ref >60)
GFR calc non Af Amer: >60 mL/min (ref >60)
Glucose, Bld: 100 mg/dL — ABNORMAL HIGH (ref 70–99)
Potassium: 3.6 mmol/L (ref 3.5–5.1)
Sodium: 137 mmol/L (ref 135–145)
Total Bilirubin: 0.8 mg/dL (ref 0.3–1.2)
Total Protein: 6.6 g/dL (ref 6.5–8.1)

## 2019-09-26 MED ORDER — METHYLPREDNISOLONE 4 MG PO TBPK: ORAL_TABLET | ORAL | 0 refills | Status: AC

## 2019-09-26 MED ORDER — CALCIUM CARBONATE ANTACID 500 MG PO CHEW
400.0000 mg | CHEWABLE_TABLET | Freq: Once | ORAL | Status: AC
  Administered 2019-09-26: 400 mg via ORAL
  Filled 2019-09-26: qty 2

## 2019-09-26 NOTE — Discharge Instructions (Signed)
Unknown cause of your rash.  Take the steroids as prescribed.  Can take additional Pepcid daily.  Take your Benadryl for itching at night as well.  You can apply topical creams.  Your calcium is mildly low.  Have given you dose in the ER make she have this rechecked with your primary care doctor.  Have given you follow-up to dermatology.  Return to the ER with any worsening symptoms.

## 2019-09-26 NOTE — ED Triage Notes (Signed)
Pt believes she had Covid 1 month ago. Her sister had it and she had the same sx at the same time.  For the past week she has had an itchy rash all over her body.

## 2019-09-26 NOTE — ED Notes (Signed)
ED Provider at bedside. 

## 2019-09-26 NOTE — ED Notes (Signed)
ED Provider at bedside discussing test results and dispo plan of care. 

## 2019-09-26 NOTE — ED Provider Notes (Signed)
[facility] EMERGENCY DEPARTMENT Provider Note   CSN: [id number] Arrival date & time: 09/26/19  7001     History Chief Complaint  Patient presents with  . Rash    Courtney Moss is a 29 y.o. female.  HPI 29 year old female with no pertinent past medical history presents to the emergency department today for evaluation of rash.  Patient reports for the past 5 days she has been having this full body rash that is pruritic in nature.  She reports associated hives.  No difficulties breathing or swallowing.  She reports lesions on her hands and soles.  No mouth lesions.  Patient denies any new soaps, detergents, medications.  Has been taking Benadryl topical hydrocortisone cream.  Patient reports having Covid 1 month ago.  She reports loss of taste and smell, nasal congestion and sore throat.  She states that she was not tested.  Patient states that she has had a rash similar to this in the past but it was not this bad.  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  Patient states that she has noticed some bruising to the areas of the rash.  Denies any bleeding from her gums, nose, urine or stool.  No significant pain.  No fevers or chills.  No known tick bites or other insect bites.    History reviewed. No pertinent past medical history.  There are no problems to display for this patient.   Past Surgical History:  Procedure Laterality Date  . INDUCED ABORTION    . TONSILLECTOMY       OB History   No obstetric history on file.     No family history on file.  Social History   Tobacco Use  . Smoking status: Former Games developer  . Smokeless tobacco: Never Used  Substance Use Topics  . Alcohol use: Yes    Comment: occ  . Drug use: Yes    Types: Marijuana    Comment: occ    Home Medications Prior to Admission medications   Medication Sig Start Date End Date Taking? Authorizing Provider  acetaminophen (TYLENOL) 325 MG tablet Take 650  mg by mouth every 6 (six) hours as needed for headache.    [provider]  benzonatate (TESSALON) 100 MG capsule Take 1 capsule (100 mg total) by mouth 3 (three) times daily as needed for cough. 06/16/17   Petrucelli, Samantha R, PA-C  fexofenadine-pseudoephedrine (ALLEGRA-D) 60-120 MG per tablet Take 1 tablet by mouth 2 (two) times daily.    [provider]  fluticasone (FLONASE) 50 MCG/ACT nasal spray Place 1 spray into both nostrils daily. 06/16/17   Petrucelli, Samantha R, PA-C  ibuprofen (ADVIL,MOTRIN) 800 MG tablet Take 1 tablet (800 mg total) by mouth every 8 (eight) hours as needed for moderate pain. 06/16/17   Petrucelli, Samantha R, PA-C  prenatal vitamin w/FE, FA (NATACHEW) 29-1 MG CHEW chewable tablet Chew 1 tablet by mouth daily at 12 noon. 01/23/14   Clement Sayres, MD  pseudoephedrine (SUDAFED) 30 MG tablet Take 30 mg by mouth every 4 (four) hours as needed for congestion.    [provider]    Allergies    Patient has no known allergies.  Review of Systems   Review of Systems  Constitutional: Negative for chills and fever.  HENT: Negative for congestion, sore throat and trouble swallowing.   Eyes: Negative for discharge.  Respiratory: Negative for shortness of breath.   Gastrointestinal: Negative for nausea and vomiting.  Musculoskeletal: Negative for myalgias.  Skin: Positive for rash.  Neurological: Negative for headaches.  Psychiatric/Behavioral: Negative for confusion.    Physical Exam Updated Vital Signs BP 125/75 (BP Location: Right Arm)   Pulse 75   Temp 98.7 F (37.1 C) (Oral)   Resp 16   Ht 5\' 7"  (1.702 m)   Wt 90.7 kg   LMP 09/23/2019   SpO2 100%   BMI 31.32 kg/m   Physical Exam Vitals and nursing note reviewed.  Constitutional:      General: She is not in acute distress.    Appearance: She is well-developed.  HENT:     Head: Normocephalic and atraumatic.     Mouth/Throat:     Mouth: Mucous membranes are moist.      Pharynx: Oropharynx is clear. No oropharyngeal exudate or posterior oropharyngeal erythema.  Eyes:     General: No scleral icterus.       Right eye: No discharge.        Left eye: No discharge.  Pulmonary:     Effort: Pulmonary effort is normal. No respiratory distress.     Breath sounds: No stridor. Wheezing present. No rhonchi or rales.  Chest:     Chest wall: No tenderness.  Musculoskeletal:        General: Normal range of motion.     Cervical back: Normal range of motion.  Skin:    General: Skin is warm and dry.     Capillary Refill: Capillary refill takes less than 2 seconds.     Coloration: Skin is not pale.     Comments: Patient with maculopapular rash to the torso, arms, back, legs.  The spares the hands and soles.  No oral mucosal lesions.  Pruritic in nature.  Urticarial hives noted as well.  No significant erythema.    Neurological:     Mental Status: She is alert.  Psychiatric:        Behavior: Behavior normal.        Thought Content: Thought content normal.        Judgment: Judgment normal.     ED Results / Procedures / Treatments   Labs (all labs ordered are listed, but only abnormal results are displayed) Labs Reviewed  CBC WITH DIFFERENTIAL/PLATELET  COMPREHENSIVE METABOLIC PANEL    EKG None  Radiology No results found.  Procedures Procedures (including critical care time)  Medications Ordered in ED Medications - No data to display  ED Course  I have reviewed the triage vital signs and the nursing notes.  Pertinent labs & imaging results that were available during my care of the patient were reviewed by me and considered in my medical decision making (see chart for details).    MDM Rules/Calculators/A&P                      Patient with maculopapular pruritic rash that is documented above.  No signs of angioedema or anaphylaxis.  Patient reports having COVID-19 1 month ago.  Labs were obtained to rule any platelet abnormality giving ecchymosis.   Patient also had liver enzymes checked which were normal.  Suspect contact dermatitis.  Patient given steroids and encouraged Benadryl and Pepcid at home.  Have given dermatology follow-up.  Patient does have mild hypocalcemia.  Patient given Tums in the ER.  Have this rechecked with primary care doctor.  Pt is hemodynamically stable, in NAD, & able to ambulate in the ED. Evaluation does not show pathology that would require  ongoing emergent intervention or inpatient treatment. I explained the diagnosis to the patient. Pain has been managed & has no complaints prior to dc. Pt is comfortable with above plan and is stable for discharge at this time. All questions were answered prior to disposition. Strict return precautions for f/u to the ED were discussed. Encouraged follow up with PCP.  Final Clinical Impression(s) / ED Diagnoses Final diagnoses:  Rash  Hypocalcemia    Rx / DC Orders ED Discharge Orders         Ordered    methylPREDNISolone (MEDROL DOSEPAK) 4 MG TBPK tablet     09/26/19 1233           Aaron Edelman 09/29/19 1123    Tegeler, Gwenyth Allegra, MD 09/29/19 1311

## 2020-01-19 DIAGNOSIS — Z419 Encounter for procedure for purposes other than remedying health state, unspecified: Secondary | ICD-10-CM | POA: Diagnosis not present

## 2020-02-19 DIAGNOSIS — Z419 Encounter for procedure for purposes other than remedying health state, unspecified: Secondary | ICD-10-CM | POA: Diagnosis not present

## 2020-03-21 DIAGNOSIS — Z419 Encounter for procedure for purposes other than remedying health state, unspecified: Secondary | ICD-10-CM | POA: Diagnosis not present

## 2020-04-20 DIAGNOSIS — Z419 Encounter for procedure for purposes other than remedying health state, unspecified: Secondary | ICD-10-CM | POA: Diagnosis not present

## 2020-05-21 DIAGNOSIS — Z419 Encounter for procedure for purposes other than remedying health state, unspecified: Secondary | ICD-10-CM | POA: Diagnosis not present

## 2020-06-20 DIAGNOSIS — Z419 Encounter for procedure for purposes other than remedying health state, unspecified: Secondary | ICD-10-CM | POA: Diagnosis not present

## 2020-07-21 DIAGNOSIS — Z419 Encounter for procedure for purposes other than remedying health state, unspecified: Secondary | ICD-10-CM | POA: Diagnosis not present

## 2020-08-21 DIAGNOSIS — Z419 Encounter for procedure for purposes other than remedying health state, unspecified: Secondary | ICD-10-CM | POA: Diagnosis not present

## 2020-09-18 DIAGNOSIS — Z419 Encounter for procedure for purposes other than remedying health state, unspecified: Secondary | ICD-10-CM | POA: Diagnosis not present

## 2020-10-02 ENCOUNTER — Other Ambulatory Visit: Payer: Self-pay

## 2020-10-02 ENCOUNTER — Emergency Department (HOSPITAL_BASED_OUTPATIENT_CLINIC_OR_DEPARTMENT_OTHER)
Admission: EM | Admit: 2020-10-02 | Discharge: 2020-10-02 | Disposition: A | Discharge status: Home / Self Care | Payer: Medicaid Other | Attending: Emergency Medicine | Admitting: Emergency Medicine

## 2020-10-02 ENCOUNTER — Encounter (HOSPITAL_BASED_OUTPATIENT_CLINIC_OR_DEPARTMENT_OTHER): Payer: Self-pay

## 2020-10-02 DIAGNOSIS — F17200 Nicotine dependence, unspecified, uncomplicated: Secondary | ICD-10-CM | POA: Insufficient documentation

## 2020-10-02 DIAGNOSIS — N12 Tubulo-interstitial nephritis, not specified as acute or chronic: Secondary | ICD-10-CM | POA: Insufficient documentation

## 2020-10-02 DIAGNOSIS — D72829 Elevated white blood cell count, unspecified: Secondary | ICD-10-CM | POA: Insufficient documentation

## 2020-10-02 DIAGNOSIS — M549 Dorsalgia, unspecified: Secondary | ICD-10-CM | POA: Diagnosis not present

## 2020-10-02 DIAGNOSIS — R300 Dysuria: Secondary | ICD-10-CM | POA: Diagnosis present

## 2020-10-02 LAB — URINALYSIS, ROUTINE W REFLEX MICROSCOPIC
Bilirubin Urine: NEGATIVE
Glucose, UA: NEGATIVE mg/dL
Ketones, ur: 15 mg/dL — AB
Nitrite: NEGATIVE
Protein, ur: NEGATIVE mg/dL
Specific Gravity, Urine: 1.015 (ref 1.005–1.030)
pH: 6.5 (ref 5.0–8.0)

## 2020-10-02 LAB — CBC
HCT: 39.6 % (ref 36.0–46.0)
Hemoglobin: 13.6 g/dL (ref 12.0–15.0)
MCH: 29.9 pg (ref 26.0–34.0)
MCHC: 34.3 g/dL (ref 30.0–36.0)
MCV: 87 fL (ref 80.0–100.0)
Platelets: 176 10*3/uL (ref 150–400)
RBC: 4.55 MIL/uL (ref 3.87–5.11)
RDW: 13 % (ref 11.5–15.5)
WBC: 14 10*3/uL — ABNORMAL HIGH (ref 4.0–10.5)
nRBC: 0 % (ref 0.0–0.2)

## 2020-10-02 LAB — COMPREHENSIVE METABOLIC PANEL
ALT: 14 U/L (ref 0–44)
AST: 16 U/L (ref 15–41)
Albumin: 4 g/dL (ref 3.5–5.0)
Alkaline Phosphatase: 46 U/L (ref 38–126)
Anion gap: 11 (ref 5–15)
BUN: 7 mg/dL (ref 6–20)
CO2: 24 mmol/L (ref 22–32)
Calcium: 8.5 mg/dL — ABNORMAL LOW (ref 8.9–10.3)
Chloride: 102 mmol/L (ref 98–111)
Creatinine, Ser: 0.81 mg/dL (ref 0.44–1.00)
GFR, Estimated: >60 mL/min (ref >60)
Glucose, Bld: 99 mg/dL (ref 70–99)
Potassium: 3.5 mmol/L (ref 3.5–5.1)
Sodium: 137 mmol/L (ref 135–145)
Total Bilirubin: 0.8 mg/dL (ref 0.3–1.2)
Total Protein: 7.5 g/dL (ref 6.5–8.1)

## 2020-10-02 LAB — URINALYSIS, MICROSCOPIC (REFLEX): WBC, UA: >50 WBC/hpf (ref 0–5)

## 2020-10-02 LAB — LIPASE, BLOOD: Lipase: 23 U/L (ref 11–51)

## 2020-10-02 LAB — PREGNANCY, URINE: Preg Test, Ur: NEGATIVE

## 2020-10-02 MED ORDER — CEPHALEXIN 500 MG PO CAPS: 500.0000 mg | ORAL_CAPSULE | Freq: Three times a day (TID) | ORAL | 0 refills | Status: AC

## 2020-10-02 MED ORDER — HYDROCODONE-ACETAMINOPHEN 5-325 MG PO TABS: 1.0000 | ORAL_TABLET | ORAL | 0 refills | Status: AC | PRN

## 2020-10-02 MED ORDER — SODIUM CHLORIDE 0.9 % IV BOLUS
1000.0000 mL | Freq: Once | INTRAVENOUS | Status: AC
  Administered 2020-10-02: 1000 mL via INTRAVENOUS

## 2020-10-02 MED ORDER — MORPHINE SULFATE (PF) 4 MG/ML IV SOLN
4.0000 mg | Freq: Once | INTRAVENOUS | Status: AC
Start: 2020-10-02 — End: 2020-10-02
  Administered 2020-10-02: 4 mg via INTRAVENOUS
  Filled 2020-10-02: qty 1

## 2020-10-02 MED ORDER — ACETAMINOPHEN 325 MG PO TABS
650.0000 mg | ORAL_TABLET | Freq: Once | ORAL | Status: AC | PRN
  Administered 2020-10-02: 650 mg via ORAL
  Filled 2020-10-02: qty 2

## 2020-10-02 MED ORDER — KETOROLAC TROMETHAMINE 30 MG/ML IJ SOLN
30.0000 mg | Freq: Once | INTRAMUSCULAR | Status: AC
  Administered 2020-10-02: 30 mg via INTRAVENOUS
  Filled 2020-10-02: qty 1

## 2020-10-02 MED ORDER — ONDANSETRON 4 MG PO TBDP: 4.0000 mg | ORAL_TABLET | Freq: Three times a day (TID) | ORAL | 0 refills | Status: AC | PRN

## 2020-10-02 MED ORDER — ONDANSETRON HCL 4 MG/2ML IJ SOLN
4.0000 mg | Freq: Once | INTRAMUSCULAR | Status: AC
  Administered 2020-10-02: 4 mg via INTRAVENOUS
  Filled 2020-10-02: qty 2

## 2020-10-02 MED ORDER — SODIUM CHLORIDE 0.9 % IV SOLN
1.0000 g | Freq: Once | INTRAVENOUS | Status: AC
  Administered 2020-10-02: 1 g via INTRAVENOUS
  Filled 2020-10-02: qty 10

## 2020-10-02 NOTE — ED Triage Notes (Addendum)
Pt has hx of bladder infections, arrives complaining of bilateral back and pelvic pain for 5 days. States "irritation" when urinating as well as vaginal itching. C/o chills, N/V, loss of appetite.

## 2020-10-02 NOTE — Discharge Instructions (Addendum)
I have written you for antibiotics.  You will take this 3 times a day.  Written you for pain medication.  Take as needed.  This medication does contain Tylenol.  Do not take any additional Tylenol while taking this medication.  May take additional anti-inflammatory such as Motrin.  Do not drive or operate heavy machinery while taking this medication.  This medication may become addictive.  I written you for some Zofran.  This is a disintegrating tablet you stick under your tongue.  Follow up with primary care provider in the next 3 to 4 days for reevaluation  Return for new or worsening symptoms.  I have written you a work note if needed.  If you are feeling improved you may return sooner.

## 2020-10-02 NOTE — ED Provider Notes (Signed)
[facility] EMERGENCY DEPARTMENT Provider Note   CSN: [id number] Arrival date & time: 10/02/20  2030     History Chief Complaint  Patient presents with  . Abdominal Pain  . Back Pain    Courtney Moss is a 30 y.o. female with past medical history significant for recurrent UTI who presents for evaluation of dysuria and bilateral back pain.  States feels consistent with her prior UTIs.  Has had some burning with urination and frequency.  Has had multiple episodes of NBNB emesis as well as nausea.  Has had some chills however has not taken temperature at home.  Has bilateral flank pain.  No history of kidney stones.  Denies chest pain, shortness of breath, right lower quadrant, left lower quadrant pain.  She denies any pelvic pain, vaginal discharge.  Denies any concerns for any STDs.  Rates her current pain a 9/10.  Denies additional aggravating or alleviating factors.  History obtained from patient and past medical records.  No interpreter used.  HPI     History reviewed. No pertinent past medical history.  There are no problems to display for this patient.   Past Surgical History:  Procedure Laterality Date  . INDUCED ABORTION    . TONSILLECTOMY       OB History   No obstetric history on file.     History reviewed. No pertinent family history.  Social History   Tobacco Use  . Smoking status: Current Some Day Smoker  . Smokeless tobacco: Never Used  Substance Use Topics  . Alcohol use: Yes    Comment: occ  . Drug use: Yes    Types: Marijuana    Comment: occ    Home Medications Prior to Admission medications   Medication Sig Start Date End Date Taking? Authorizing Provider  cephALEXin (KEFLEX) 500 MG capsule Take 1 capsule (500 mg total) by mouth 3 (three) times daily for 7 days. 10/02/20 10/09/20 Yes Letecia Arps A, PA-C  HYDROcodone-acetaminophen (NORCO/VICODIN) 5-325 MG tablet Take 1 tablet by mouth every 4 (four) hours as needed. 10/02/20   Yes Levita Monical A, PA-C  ondansetron (ZOFRAN ODT) 4 MG disintegrating tablet Take 1 tablet (4 mg total) by mouth every 8 (eight) hours as needed for nausea or vomiting. 10/02/20  Yes Voncile Schwarz A, PA-C  acetaminophen (TYLENOL) 325 MG tablet Take 650 mg by mouth every 6 (six) hours as needed for headache.    [provider]  benzonatate (TESSALON) 100 MG capsule Take 1 capsule (100 mg total) by mouth 3 (three) times daily as needed for cough. 06/16/17   Petrucelli, Samantha R, PA-C  fexofenadine-pseudoephedrine (ALLEGRA-D) 60-120 MG per tablet Take 1 tablet by mouth 2 (two) times daily.    [provider]  fluticasone (FLONASE) 50 MCG/ACT nasal spray Place 1 spray into both nostrils daily. 06/16/17   Petrucelli, Samantha R, PA-C  ibuprofen (ADVIL,MOTRIN) 800 MG tablet Take 1 tablet (800 mg total) by mouth every 8 (eight) hours as needed for moderate pain. 06/16/17   Petrucelli, Pleas Koch, PA-C  methylPREDNISolone (MEDROL DOSEPAK) 4 MG TBPK tablet Take as directed 09/26/19   Demetrios Loll T, PA-C  prenatal vitamin w/FE, FA (NATACHEW) 29-1 MG CHEW chewable tablet Chew 1 tablet by mouth daily at 12 noon. 01/23/14   Clement Sayres, MD  pseudoephedrine (SUDAFED) 30 MG tablet Take 30 mg by mouth every 4 (four) hours as needed for congestion.    [provider]    Allergies    Patient  has no known allergies.  Review of Systems   Review of Systems  Constitutional: Negative.   HENT: Negative.   Respiratory: Negative.   Cardiovascular: Negative.   Gastrointestinal: Positive for abdominal pain, nausea and vomiting. Negative for abdominal distention, anal bleeding, blood in stool, constipation, diarrhea and rectal pain.  Genitourinary: Positive for dysuria, flank pain, frequency and urgency.  Skin: Negative.   Neurological: Negative.   All other systems reviewed and are negative.   Physical Exam Updated Vital Signs BP 132/78 (BP Location: Right Arm)   Pulse 82    Temp 99.6 F (37.6 C) (Oral)   Resp 19   Ht 5\' 7"  (1.702 m)   Wt 81.6 kg   LMP 09/22/2020   SpO2 99%   BMI 28.19 kg/m   Physical Exam Vitals and nursing note reviewed.  Constitutional:      General: She is not in acute distress.    Appearance: She is well-developed. She is not ill-appearing, toxic-appearing or diaphoretic.  HENT:     Head: Normocephalic and atraumatic.     Mouth/Throat:     Mouth: Mucous membranes are moist.  Eyes:     Pupils: Pupils are equal, round, and reactive to light.  Cardiovascular:     Rate and Rhythm: Normal rate.     Heart sounds: Normal heart sounds.  Pulmonary:     Effort: Pulmonary effort is normal. No respiratory distress.     Breath sounds: Normal breath sounds.     Comments: Clear to auscultation bilaterally.  Speaks in full sentences without difficulty. Abdominal:     General: Bowel sounds are normal. There is no distension.     Palpations: Abdomen is soft.     Tenderness: There is abdominal tenderness. There is right CVA tenderness and left CVA tenderness. There is no guarding or rebound. Negative signs include Murphy's sign and McBurney's sign.     Hernia: No hernia is present.     Comments: Anterior abdomen soft.  Diffuse tenderness of bilateral flanks.  Musculoskeletal:        General: Normal range of motion.     Cervical back: Normal range of motion.     Comments: No bony tenderness.  Moves all 4 extremities without difficulty.  Skin:    General: Skin is warm and dry.     Capillary Refill: Capillary refill takes less than 2 seconds.     Comments: No edema, erythema or warmth.  No fluctuance or induration.  Neurological:     General: No focal deficit present.     Mental Status: She is alert.    ED Results / Procedures / Treatments   Labs (all labs ordered are listed, but only abnormal results are displayed) Labs Reviewed  COMPREHENSIVE METABOLIC PANEL - Abnormal; Notable for the following components:      Result Value    Calcium 8.5 (*)    All other components within normal limits  CBC - Abnormal; Notable for the following components:   WBC 14.0 (*)    All other components within normal limits  URINALYSIS, ROUTINE W REFLEX MICROSCOPIC - Abnormal; Notable for the following components:   APPearance CLOUDY (*)    Hgb urine dipstick MODERATE (*)    Ketones, ur 15 (*)    Leukocytes,Ua LARGE (*)    All other components within normal limits  URINALYSIS, MICROSCOPIC (REFLEX) - Abnormal; Notable for the following components:   Bacteria, UA FEW (*)    All other components within normal limits  URINE  CULTURE  LIPASE, BLOOD  PREGNANCY, URINE    EKG None  Radiology No results found.  Procedures Procedures   Medications Ordered in ED Medications  acetaminophen (TYLENOL) tablet 650 mg (650 mg Oral Given 10/02/20 2045)  ondansetron (ZOFRAN) injection 4 mg (4 mg Intravenous Given 10/02/20 2239)  sodium chloride 0.9 % bolus 1,000 mL (1,000 mLs Intravenous New Bag/Given 10/02/20 2239)  morphine 4 MG/ML injection 4 mg (4 mg Intravenous Given 10/02/20 2239)  ketorolac (TORADOL) 30 MG/ML injection 30 mg (30 mg Intravenous Given 10/02/20 2239)  cefTRIAXone (ROCEPHIN) 1 g in sodium chloride 0.9 % 100 mL IVPB ( Intravenous Stopped 10/02/20 2318)   ED Course  I have reviewed the triage vital signs and the nursing notes.  Pertinent labs & imaging results that were available during my care of the patient were reviewed by me and considered in my medical decision making (see chart for details).  30 year old here for evaluation of dysuria, bilateral flank pain.  History of UTI states this feels similar.  On arrival she is febrile however without tachycardia, tachypnea or hypoxia. Does not appear septic. Given antipyretic in triage.  Her heart and lungs are clear.  Abdomen anteriorly is soft however does have bilateral CVA tenderness.  No history of stones.  She denies any pelvic pain, vaginal discharge or concerns for  STDs.  Labs and imaging personally reviewed and interpreted:  CBC with leukocytosis at 14.0 Lipase 23 Metabolic panel calcium 8.5, no additional electrolyte, renal or liver abnormality Pregnancy test is negative UA positive for UTI, culture sent  Patient given IV fluids, pain control, antiemetics as well as Rocephin for likely pyelonephritis.  Reassessed.  Pain significantly improved.  Will p.o. challenge.  Patient reassessed.  Tolerating p.o. intake.  Pain controlled.  Patient symptoms likely due to pyelonephritis.  Low suspicion for infected stone, intra-abdominal abscess or acute surgical abdomen.  Will DC home with antibiotics and antiemetics.  She will follow-up with PCP for reevaluation.  She will return for any worsening symptoms  Patient is nontoxic, nonseptic appearing, in no apparent distress.  Patient's pain and other symptoms adequately managed in emergency department.  Fluid bolus given.  Labs, imaging and vitals reviewed.  Patient does not meet the SIRS or Sepsis criteria.  On repeat exam patient does not have a surgical abdomin and there are no peritoneal signs.  No indication of appendicitis, bowel obstruction, bowel perforation, cholecystitis, diverticulitis, torsion, PID or ectopic pregnancy.    The patient has been appropriately medically screened and/or stabilized in the ED. I have low suspicion for any other emergent medical condition which would require further screening, evaluation or treatment in the ED or require inpatient management.  Patient is hemodynamically stable and in no acute distress.  Patient able to ambulate in department prior to ED.  Evaluation does not show acute pathology that would require ongoing or additional emergent interventions while in the emergency department or further inpatient treatment.  I have discussed the diagnosis with the patient and answered all questions.  Pain is been managed while in the emergency department and patient has no further  complaints prior to discharge.  Patient is comfortable with plan discussed in room and is stable for discharge at this time.  I have discussed strict return precautions for returning to the emergency department.  Patient was encouraged to follow-up with PCP/specialist refer to at discharge.    MDM Rules/Calculators/A&P  Final Clinical Impression(s) / ED Diagnoses Final diagnoses:  Pyelonephritis    Rx / DC Orders ED Discharge Orders         Ordered    HYDROcodone-acetaminophen (NORCO/VICODIN) 5-325 MG tablet  Every 4 hours PRN        10/02/20 2321    ondansetron (ZOFRAN ODT) 4 MG disintegrating tablet  Every 8 hours PRN        10/02/20 2321    cephALEXin (KEFLEX) 500 MG capsule  3 times daily        10/02/20 2321           Ibrahim Mcpheeters A, PA-C 10/02/20 2324    Curatolo, Adam, DO 10/02/20 2334

## 2020-10-05 LAB — URINE CULTURE: Culture: >=100000 — AB

## 2020-10-19 DIAGNOSIS — Z419 Encounter for procedure for purposes other than remedying health state, unspecified: Secondary | ICD-10-CM | POA: Diagnosis not present

## 2020-11-18 DIAGNOSIS — Z419 Encounter for procedure for purposes other than remedying health state, unspecified: Secondary | ICD-10-CM | POA: Diagnosis not present

## 2020-12-19 DIAGNOSIS — Z419 Encounter for procedure for purposes other than remedying health state, unspecified: Secondary | ICD-10-CM | POA: Diagnosis not present

## 2021-01-18 DIAGNOSIS — Z419 Encounter for procedure for purposes other than remedying health state, unspecified: Secondary | ICD-10-CM | POA: Diagnosis not present

## 2021-02-18 DIAGNOSIS — Z419 Encounter for procedure for purposes other than remedying health state, unspecified: Secondary | ICD-10-CM | POA: Diagnosis not present

## 2021-03-21 DIAGNOSIS — Z419 Encounter for procedure for purposes other than remedying health state, unspecified: Secondary | ICD-10-CM | POA: Diagnosis not present

## 2021-04-20 DIAGNOSIS — Z419 Encounter for procedure for purposes other than remedying health state, unspecified: Secondary | ICD-10-CM | POA: Diagnosis not present

## 2021-05-21 DIAGNOSIS — Z419 Encounter for procedure for purposes other than remedying health state, unspecified: Secondary | ICD-10-CM | POA: Diagnosis not present

## 2021-06-20 DIAGNOSIS — Z419 Encounter for procedure for purposes other than remedying health state, unspecified: Secondary | ICD-10-CM | POA: Diagnosis not present

## 2021-07-21 DIAGNOSIS — Z419 Encounter for procedure for purposes other than remedying health state, unspecified: Secondary | ICD-10-CM | POA: Diagnosis not present

## 2021-08-21 DIAGNOSIS — Z419 Encounter for procedure for purposes other than remedying health state, unspecified: Secondary | ICD-10-CM | POA: Diagnosis not present

## 2021-09-18 DIAGNOSIS — Z419 Encounter for procedure for purposes other than remedying health state, unspecified: Secondary | ICD-10-CM | POA: Diagnosis not present

## 2021-10-19 DIAGNOSIS — Z419 Encounter for procedure for purposes other than remedying health state, unspecified: Secondary | ICD-10-CM | POA: Diagnosis not present

## 2021-11-18 DIAGNOSIS — Z419 Encounter for procedure for purposes other than remedying health state, unspecified: Secondary | ICD-10-CM | POA: Diagnosis not present

## 2021-12-19 DIAGNOSIS — Z419 Encounter for procedure for purposes other than remedying health state, unspecified: Secondary | ICD-10-CM | POA: Diagnosis not present

## 2021-12-24 DIAGNOSIS — N3001 Acute cystitis with hematuria: Secondary | ICD-10-CM | POA: Diagnosis not present

## 2021-12-24 DIAGNOSIS — R350 Frequency of micturition: Secondary | ICD-10-CM | POA: Diagnosis not present

## 2022-01-18 DIAGNOSIS — Z419 Encounter for procedure for purposes other than remedying health state, unspecified: Secondary | ICD-10-CM | POA: Diagnosis not present

## 2022-02-18 DIAGNOSIS — Z419 Encounter for procedure for purposes other than remedying health state, unspecified: Secondary | ICD-10-CM | POA: Diagnosis not present

## 2022-03-20 DIAGNOSIS — N3001 Acute cystitis with hematuria: Secondary | ICD-10-CM | POA: Diagnosis not present

## 2022-03-20 DIAGNOSIS — R109 Unspecified abdominal pain: Secondary | ICD-10-CM | POA: Diagnosis not present

## 2022-03-21 DIAGNOSIS — Z419 Encounter for procedure for purposes other than remedying health state, unspecified: Secondary | ICD-10-CM | POA: Diagnosis not present

## 2022-04-20 DIAGNOSIS — Z419 Encounter for procedure for purposes other than remedying health state, unspecified: Secondary | ICD-10-CM | POA: Diagnosis not present

## 2022-04-22 DIAGNOSIS — Z3009 Encounter for other general counseling and advice on contraception: Secondary | ICD-10-CM | POA: Diagnosis not present

## 2022-04-22 DIAGNOSIS — F17290 Nicotine dependence, other tobacco product, uncomplicated: Secondary | ICD-10-CM | POA: Diagnosis not present

## 2022-05-21 DIAGNOSIS — Z419 Encounter for procedure for purposes other than remedying health state, unspecified: Secondary | ICD-10-CM | POA: Diagnosis not present

## 2022-06-20 DIAGNOSIS — Z419 Encounter for procedure for purposes other than remedying health state, unspecified: Secondary | ICD-10-CM | POA: Diagnosis not present

## 2022-07-21 DIAGNOSIS — Z419 Encounter for procedure for purposes other than remedying health state, unspecified: Secondary | ICD-10-CM | POA: Diagnosis not present

## 2022-08-21 DIAGNOSIS — Z419 Encounter for procedure for purposes other than remedying health state, unspecified: Secondary | ICD-10-CM | POA: Diagnosis not present

## 2022-09-19 DIAGNOSIS — Z419 Encounter for procedure for purposes other than remedying health state, unspecified: Secondary | ICD-10-CM | POA: Diagnosis not present

## 2022-10-20 DIAGNOSIS — Z419 Encounter for procedure for purposes other than remedying health state, unspecified: Secondary | ICD-10-CM | POA: Diagnosis not present

## 2022-11-19 DIAGNOSIS — Z419 Encounter for procedure for purposes other than remedying health state, unspecified: Secondary | ICD-10-CM | POA: Diagnosis not present

## 2022-12-20 DIAGNOSIS — Z419 Encounter for procedure for purposes other than remedying health state, unspecified: Secondary | ICD-10-CM | POA: Diagnosis not present

## 2023-01-19 DIAGNOSIS — Z419 Encounter for procedure for purposes other than remedying health state, unspecified: Secondary | ICD-10-CM | POA: Diagnosis not present

## 2023-02-03 DIAGNOSIS — R059 Cough, unspecified: Secondary | ICD-10-CM | POA: Diagnosis not present

## 2023-02-03 DIAGNOSIS — J029 Acute pharyngitis, unspecified: Secondary | ICD-10-CM | POA: Diagnosis not present

## 2023-02-03 DIAGNOSIS — U071 COVID-19: Secondary | ICD-10-CM | POA: Diagnosis not present

## 2023-02-19 DIAGNOSIS — Z419 Encounter for procedure for purposes other than remedying health state, unspecified: Secondary | ICD-10-CM | POA: Diagnosis not present
